# Patient Record
Sex: Male | Born: 1980 | Race: Black or African American | Hispanic: No | Marital: Single | Smoking: Current every day smoker
Health system: Southern US, Community
[De-identification: ages and names within clinical notes are randomized; demographics above are authoritative.]

## PROBLEM LIST (undated history)

## (undated) DIAGNOSIS — J45909 Unspecified asthma, uncomplicated: Secondary | ICD-10-CM

---

## 2014-03-19 ENCOUNTER — Emergency Department (HOSPITAL_BASED_OUTPATIENT_CLINIC_OR_DEPARTMENT_OTHER)
Admission: EM | Admit: 2014-03-19 | Discharge: 2014-03-19 | Disposition: A | Discharge status: Home / Self Care | Payer: Self-pay | Attending: Emergency Medicine | Admitting: Emergency Medicine

## 2014-03-19 ENCOUNTER — Encounter (HOSPITAL_BASED_OUTPATIENT_CLINIC_OR_DEPARTMENT_OTHER): Payer: Self-pay | Admitting: Emergency Medicine

## 2014-03-19 ENCOUNTER — Emergency Department (HOSPITAL_BASED_OUTPATIENT_CLINIC_OR_DEPARTMENT_OTHER): Payer: Self-pay

## 2014-03-19 DIAGNOSIS — J069 Acute upper respiratory infection, unspecified: Secondary | ICD-10-CM

## 2014-03-19 DIAGNOSIS — H9209 Otalgia, unspecified ear: Secondary | ICD-10-CM | POA: Insufficient documentation

## 2014-03-19 DIAGNOSIS — R000 Tachycardia, unspecified: Secondary | ICD-10-CM | POA: Insufficient documentation

## 2014-03-19 DIAGNOSIS — J45909 Unspecified asthma, uncomplicated: Secondary | ICD-10-CM | POA: Insufficient documentation

## 2014-03-19 DIAGNOSIS — H5710 Ocular pain, unspecified eye: Secondary | ICD-10-CM | POA: Insufficient documentation

## 2014-03-19 DIAGNOSIS — F17200 Nicotine dependence, unspecified, uncomplicated: Secondary | ICD-10-CM | POA: Insufficient documentation

## 2014-03-19 HISTORY — DX: Unspecified asthma, uncomplicated: J45.909

## 2014-03-19 MED ORDER — PSEUDOEPHEDRINE HCL 60 MG PO TABS: 60.0000 mg | ORAL_TABLET | ORAL | Status: AC | PRN

## 2014-03-19 NOTE — Discharge Instructions (Signed)

## 2014-03-19 NOTE — ED Notes (Signed)
head pain and URI sx x 4 days

## 2014-03-19 NOTE — ED Provider Notes (Signed)
CSN: [id number]     Arrival date & time 03/19/14  1550 History  This chart was scribed for Shanna CiscoMegan E Alean Kromer, MD by Blanchard KelchNicole Curnes, ED Scribe. The patient was seen in room MH11/MH11. Patient's care was started at 5:53 PM.    Chief Complaint  Patient presents with  . URI    Patient is a 33 y.o. male presenting with URI. The history is provided by the patient. No language interpreter was used.  URI Presenting symptoms: congestion, cough, ear pain, fever and rhinorrhea   Onset quality:  Gradual Duration:  4 days Timing:  Constant Progression:  Unchanged Chronicity:  New Relieved by:  Nothing Ineffective treatments: Vapor Rub. Associated symptoms: headaches   Risk factors: no sick contacts     HPI Comments: Scott Collier is a 33 y.o. male who presents to the Emergency Department complaining of a constant upper respiratory infection for four days. Associated symptoms include congestion, rhinorrhea with blood streaked discharge, bilateral eye pain, right ear pain, fever, chills and left frontal headache. He describes the headache as throbbing and constant. He states he also has had a productive cough with blood streaked sputum. He reports using Vapor Rub on his chest without relief. He has been drinking a lot of water and eating normally. He denies sick contacts or recent antibiotic use.   Pt denies having a PCP currently.  Past Medical History  Diagnosis Date  . Asthma    History reviewed. No pertinent past surgical history. No family history on file. History  Substance Use Topics  . Smoking status: Current Every Day Smoker    Types: Cigarettes  . Smokeless tobacco: Never Used  . Alcohol Use: No    Review of Systems  Constitutional: Positive for fever and chills. Negative for activity change and appetite change.  HENT: Positive for congestion, ear pain and rhinorrhea. Negative for facial swelling and trouble swallowing.   Eyes: Negative for photophobia and pain.  Respiratory:  Positive for cough. Negative for chest tightness and shortness of breath.   Cardiovascular: Negative for chest pain and leg swelling.  Gastrointestinal: Negative for nausea, vomiting, abdominal pain, diarrhea and constipation.  Endocrine: Negative for polydipsia and polyuria.  Genitourinary: Negative for dysuria, urgency, decreased urine volume and difficulty urinating.  Musculoskeletal: Negative for back pain and gait problem.  Skin: Negative for color change, rash and wound.  Allergic/Immunologic: Negative for immunocompromised state.  Neurological: Positive for headaches. Negative for dizziness, facial asymmetry, speech difficulty, weakness and numbness.  Psychiatric/Behavioral: Negative for confusion, decreased concentration and agitation.      Allergies  Aspirin  Home Medications   Current Outpatient Rx  Name  Route  Sig  Dispense  Refill  . pseudoephedrine (SUDAFED) 60 MG tablet   Oral   Take 1 tablet (60 mg total) by mouth every 4 (four) hours as needed for congestion.   30 tablet   0     Triage Vitals: BP 133/84  Pulse 103  Temp(Src) 100.1 F (37.8 C) (Oral)  Resp 20  Ht 5\' 6"  (1.676 m)  Wt 140 lb (63.504 kg)  BMI 22.61 kg/m2  SpO2 100%  Physical Exam  Nursing note and vitals reviewed. Constitutional: He is oriented to person, place, and time. He appears well-developed and well-nourished. No distress.  HENT:  Head: Normocephalic and atraumatic.  Mouth/Throat: No oropharyngeal exudate.  Bilateral turbinate enlargement.   Eyes: Pupils are equal, round, and reactive to light.  Neck: Normal range of motion. Neck supple.  Right sided anterior  cervical adenopathy.  Cardiovascular: Normal rate, regular rhythm and normal heart sounds.  Exam reveals no gallop and no friction rub.   No murmur heard. Pulmonary/Chest: Effort normal and breath sounds normal. No respiratory distress. He has no wheezes. He has no rales.  Abdominal: Soft. Bowel sounds are normal. He  exhibits no distension and no mass. There is no tenderness. There is no rebound and no guarding.  Musculoskeletal: Normal range of motion. He exhibits no edema and no tenderness.  Lymphadenopathy:    He has cervical adenopathy.  Neurological: He is alert and oriented to person, place, and time.  Skin: Skin is warm and dry.  Psychiatric: He has a normal mood and affect.    ED Course  Procedures (including critical care time)  DIAGNOSTIC STUDIES: Oxygen Saturation is 100% on room air, normal by my interpretation.    COORDINATION OF CARE: 5:58 PM -Clinical suspicion of URI. Recommend decongestants, fluids and rest. Patient verbalizes understanding and agrees with treatment plan.    Labs Review Labs Reviewed - No data to display Imaging Review Dg Chest 2 View  03/19/2014   CLINICAL DATA:  Fever.  Shortness of breath.  Hemoptysis.  Asthma.  EXAM: CHEST  2 VIEW  COMPARISON:  None.  FINDINGS: The heart size and mediastinal contours are within normal limits. Both lungs are clear. The visualized skeletal structures are unremarkable.  IMPRESSION: No active cardiopulmonary disease.   Electronically Signed   By: Myles Rosenthal M.D.   On: 03/19/2014 17:14     EKG Interpretation None      MDM   Final diagnoses:  Viral URI    Pt is a 33 y.o. male with Pmhx as above who presents with about 4 days of nasal congestion, headache, cough, fever, chills, bloody rhinorrhea.  CXR nml.  He also reports coughing up blood, but I believe this to be post-nasal gtt w/ bloody rhinorrhea. On PE, Pt febrile, mildly tachycardic, well-hydrated appearing. He has BL enlarged turbinates.  Suspect viral URI. WIll d/chome w/ instructions for supportive care, & decongestants.  Return precautions given for new or worsening symptoms including SOB, inability to tolerate PO.        I personally performed the services described in this documentation, which was scribed in my presence. The recorded information has been  reviewed and is accurate.     Shanna Cisco, MD 03/19/14 [phone]

## 2014-12-26 IMAGING — CR DG CHEST 2V
2 series · 2 of 2 positions shown · non-contrast
Comparison: None.

CLINICAL DATA: Fever.  Shortness of breath.  Hemoptysis.  Asthma.

EXAM:
CHEST  2 VIEW

[w chest pa]
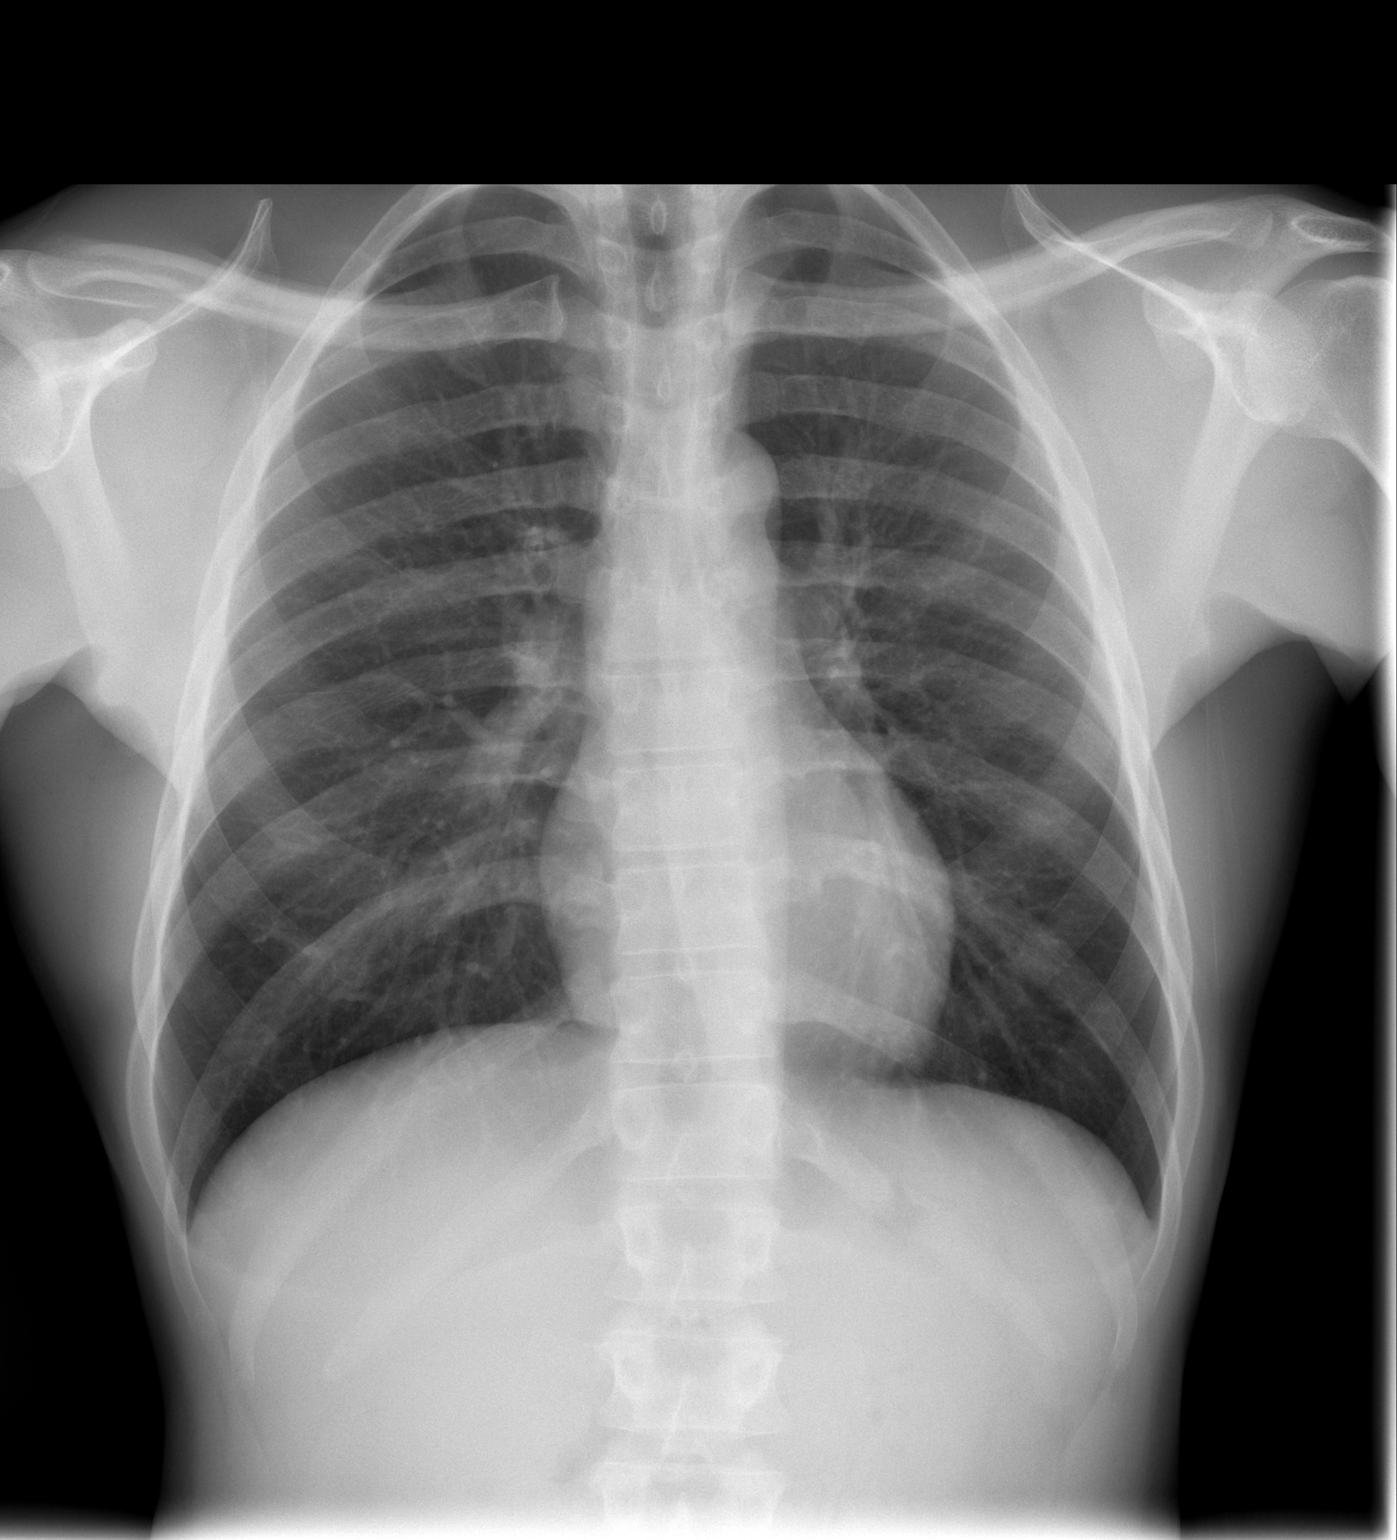

[w chest lat]
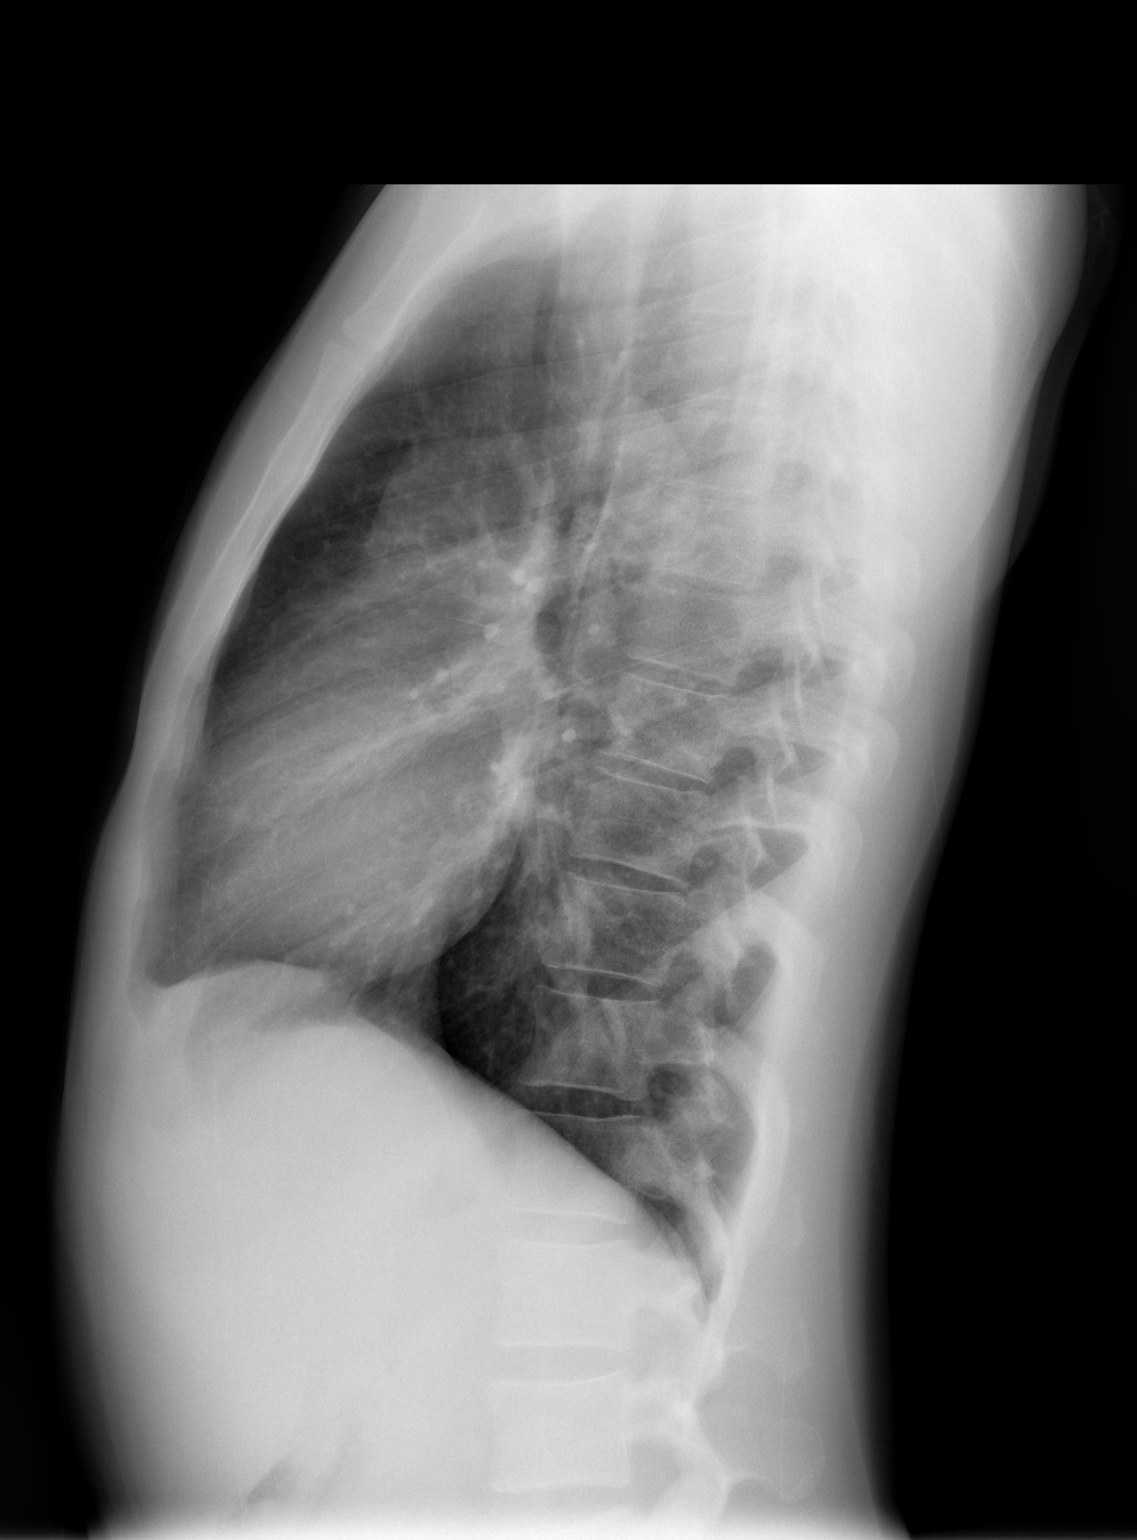

[2 of 2 positions shown; findings below may reference images not displayed]

FINDINGS: The heart size and mediastinal contours are within normal limits.
Both lungs are clear. The visualized skeletal structures are
unremarkable.
IMPRESSION: No active cardiopulmonary disease.
